# Patient Record
Sex: Female | Born: 1945 | Race: Black or African American | Hispanic: No | Marital: Married
Health system: Southern US, Community
[De-identification: ages and names within clinical notes are randomized; demographics above are authoritative.]

---

## 2021-03-15 ENCOUNTER — Other Ambulatory Visit: Payer: Self-pay | Admitting: Family Medicine

## 2021-03-15 DIAGNOSIS — M25562 Pain in left knee: Secondary | ICD-10-CM

## 2021-03-19 ENCOUNTER — Other Ambulatory Visit: Payer: Self-pay | Admitting: Family Medicine

## 2021-03-19 DIAGNOSIS — Z1231 Encounter for screening mammogram for malignant neoplasm of breast: Secondary | ICD-10-CM

## 2021-03-30 ENCOUNTER — Other Ambulatory Visit: Payer: Self-pay

## 2021-04-02 ENCOUNTER — Other Ambulatory Visit: Payer: Self-pay | Admitting: Family Medicine

## 2021-04-02 DIAGNOSIS — Z13820 Encounter for screening for osteoporosis: Secondary | ICD-10-CM

## 2021-04-02 DIAGNOSIS — Z780 Asymptomatic menopausal state: Secondary | ICD-10-CM

## 2021-04-07 ENCOUNTER — Other Ambulatory Visit: Payer: Self-pay

## 2021-04-12 ENCOUNTER — Ambulatory Visit: Payer: Self-pay

## 2021-04-23 ENCOUNTER — Other Ambulatory Visit: Payer: Self-pay

## 2021-04-23 ENCOUNTER — Ambulatory Visit
Admission: RE | Admit: 2021-04-23 | Discharge: 2021-04-23 | Disposition: A | Discharge status: Home / Self Care | Payer: Medicare PPO | Source: Ambulatory Visit | Attending: Family Medicine | Admitting: Family Medicine

## 2021-04-23 DIAGNOSIS — M25562 Pain in left knee: Secondary | ICD-10-CM

## 2022-03-07 ENCOUNTER — Emergency Department (HOSPITAL_COMMUNITY)
Admission: EM | Admit: 2022-03-07 | Discharge: 2022-03-08 | Disposition: A | Discharge status: Skilled Nursing Facility | Payer: Medicare (Managed Care) | Attending: Emergency Medicine | Admitting: Emergency Medicine

## 2022-03-07 ENCOUNTER — Other Ambulatory Visit: Payer: Self-pay

## 2022-03-07 DIAGNOSIS — R4182 Altered mental status, unspecified: Secondary | ICD-10-CM | POA: Diagnosis not present

## 2022-03-07 DIAGNOSIS — Z1152 Encounter for screening for COVID-19: Secondary | ICD-10-CM | POA: Insufficient documentation

## 2022-03-07 DIAGNOSIS — F03918 Unspecified dementia, unspecified severity, with other behavioral disturbance: Secondary | ICD-10-CM | POA: Diagnosis not present

## 2022-03-07 DIAGNOSIS — F0390 Unspecified dementia without behavioral disturbance: Secondary | ICD-10-CM | POA: Diagnosis not present

## 2022-03-07 NOTE — ED Triage Notes (Signed)
Pt presents to ED with [employer] with IVC paperwork.

## 2022-03-07 NOTE — ED Provider Notes (Signed)
  Marissa Soto Provider Note   CSN: [id number] Arrival date & time: 03/07/22  2110     History {Add pertinent medical, surgical, social history, OB history to HPI:1} No chief complaint on file.   [name] is a 77 y.o. female.  HPI     Home Medications Prior to Admission medications   Not on File      Allergies    Patient has no allergy information on record.    Review of Systems   Review of Systems  Physical Exam Updated Vital Signs There were no vitals taken for this visit. Physical Exam  ED Results / Procedures / Treatments   Labs (all labs ordered are listed, but only abnormal results are displayed) Labs Reviewed - No data to display  EKG None  Radiology No results found.  Procedures Procedures  {Document cardiac monitor, telemetry assessment procedure when appropriate:1}  Medications Ordered in ED Medications - No data to display  ED Course/ Medical Decision Making/ A&P   {   Click here for ABCD2, HEART and other calculatorsREFRESH Note before signing :1}                          Medical Decision Making  ***  {Document critical care time when appropriate:1} {Document review of labs and clinical decision tools ie heart score, Chads2Vasc2 etc:1}  {Document your independent review of radiology images, and any outside records:1} {Document your discussion with family members, caretakers, and with consultants:1} {Document social determinants of health affecting pt's care:1} {Document your decision making why or why not admission, treatments were needed:1} Final Clinical Impression(s) / ED Diagnoses Final diagnoses:  None    Rx / DC Orders ED Discharge Orders     None

## 2022-03-07 NOTE — ED Notes (Signed)
Per IVC Paperwork, "Respondent has dementia and is declining. Respondent talks to her deceased husband and answers. Respondent sees people who are not there inside home. Respondent leaves her front door open and unlocked at all times of night. Respondents living conditions are condemnable. There is black mold and no running water. Respondent does have possession of a handgun."

## 2022-03-08 ENCOUNTER — Emergency Department (HOSPITAL_COMMUNITY): Payer: Medicare (Managed Care)

## 2022-03-08 ENCOUNTER — Other Ambulatory Visit (HOSPITAL_COMMUNITY): Payer: TRICARE For Life (TFL)

## 2022-03-08 DIAGNOSIS — F03918 Unspecified dementia, unspecified severity, with other behavioral disturbance: Secondary | ICD-10-CM | POA: Diagnosis not present

## 2022-03-08 DIAGNOSIS — R4182 Altered mental status, unspecified: Secondary | ICD-10-CM | POA: Diagnosis not present

## 2022-03-08 DIAGNOSIS — F0390 Unspecified dementia without behavioral disturbance: Secondary | ICD-10-CM | POA: Diagnosis not present

## 2022-03-08 LAB — RESP PANEL BY RT-PCR (RSV, FLU A&B, COVID)  RVPGX2
Influenza A by PCR: NEGATIVE
Influenza B by PCR: NEGATIVE
Resp Syncytial Virus by PCR: NEGATIVE
SARS Coronavirus 2 by RT PCR: NEGATIVE

## 2022-03-08 LAB — CBC WITH DIFFERENTIAL/PLATELET
Abs Immature Granulocytes: 0.02 10*3/uL (ref 0.00–0.07)
Basophils Absolute: 0 10*3/uL (ref 0.0–0.1)
Basophils Relative: 1 %
Eosinophils Absolute: 0.1 10*3/uL (ref 0.0–0.5)
Eosinophils Relative: 1 %
HCT: 38.3 % (ref 36.0–46.0)
Hemoglobin: 12.3 g/dL (ref 12.0–15.0)
Immature Granulocytes: 0 %
Lymphocytes Relative: 28 %
Lymphs Abs: 2.5 10*3/uL (ref 0.7–4.0)
MCH: 31.1 pg (ref 26.0–34.0)
MCHC: 32.1 g/dL (ref 30.0–36.0)
MCV: 96.7 fL (ref 80.0–100.0)
Monocytes Absolute: 0.6 10*3/uL (ref 0.1–1.0)
Monocytes Relative: 7 %
Neutro Abs: 5.6 10*3/uL (ref 1.7–7.7)
Neutrophils Relative %: 63 %
Platelets: 452 10*3/uL — ABNORMAL HIGH (ref 150–400)
RBC: 3.96 MIL/uL (ref 3.87–5.11)
RDW: 14.9 % (ref 11.5–15.5)
WBC: 8.8 10*3/uL (ref 4.0–10.5)
nRBC: 0 % (ref 0.0–0.2)

## 2022-03-08 LAB — COMPREHENSIVE METABOLIC PANEL
ALT: 13 U/L (ref 0–44)
AST: 16 U/L (ref 15–41)
Albumin: 3.6 g/dL (ref 3.5–5.0)
Alkaline Phosphatase: 51 U/L (ref 38–126)
Anion gap: 10 (ref 5–15)
BUN: 11 mg/dL (ref 8–23)
CO2: 25 mmol/L (ref 22–32)
Calcium: 8.9 mg/dL (ref 8.9–10.3)
Chloride: 103 mmol/L (ref 98–111)
Creatinine, Ser: 0.59 mg/dL (ref 0.44–1.00)
GFR, Estimated: >60 mL/min (ref >60)
Glucose, Bld: 97 mg/dL (ref 70–99)
Potassium: 3.3 mmol/L — ABNORMAL LOW (ref 3.5–5.1)
Sodium: 138 mmol/L (ref 135–145)
Total Bilirubin: 0.3 mg/dL (ref 0.3–1.2)
Total Protein: 6.7 g/dL (ref 6.5–8.1)

## 2022-03-08 LAB — TSH: TSH: 2.07 u[IU]/mL (ref 0.350–4.500)

## 2022-03-08 LAB — CBG MONITORING, ED: Glucose-Capillary: 112 mg/dL — ABNORMAL HIGH (ref 70–99)

## 2022-03-08 MED ORDER — OLANZAPINE 5 MG PO TBDP: 2.5000 mg | ORAL_TABLET | Freq: Every day | ORAL | Status: DC

## 2022-03-08 MED ORDER — OLANZAPINE 5 MG PO TBDP: 2.5000 mg | ORAL_TABLET | Freq: Every day | ORAL | 0 refills | Status: AC

## 2022-03-08 MED ORDER — LOSARTAN POTASSIUM 25 MG PO TABS: 25.0000 mg | ORAL_TABLET | Freq: Every day | ORAL | Status: DC

## 2022-03-08 NOTE — Progress Notes (Addendum)
Transition of Care [facility]) - Emergency Department Mini Assessment   Patient Details  Name: Marissa Soto MRN: [id number] Date of Birth: 10/06/1945  Transition of Care [facility]) CM/SW Contact:    Marissa Relic, LCSW Phone Number: 03/08/2022, 1:01 PM   Clinical Narrative: This CSW spoke to the pt's sister, Marissa Soto, who provided the pt's SSN. This CSW contacted registration and had them enter. Verified entry. This CSW informed Marissa Soto that PT seen the pt and they are not recommending a PT follow up. Marissa Soto states she and her family have been working with an Assisted Living in [facility] and that she is on the way to see the admissions coordinator to be provided the paperwork. She reports she will then be going to the bank to get the funds to pay for the pt's stay. She did inform that the admissions coordinator required paperwork with the pt's medication. This CSW informed that the pt will receive an After Visit Summary which will list the medications she should continue. Per Marissa Soto, the Assisted Living states they are able to provide transportation from the hospital to the ALF. This CSW informed if they give them permission to pick her up, they may do so today; however, if she is unable to admit today, family would need to pick her up and provide transportation to the ALF once she is able to admit. Marissa Soto expressed understanding.  Addend @ 2:42 PM This CSW attempted to contact Marissa Soto to inquire about time of admission to ALF today.   ED Mini Assessment: What brought you to the Emergency Department? : IVCd by family due to living conditions  Barriers to Discharge: SNF Pending bed offer  Barrier interventions: snf workup          Patient Contact and Communications Key Contact 1: Marissa Soto (sister) -[phone]   Spoke with: Marissa Soto and the pt's brother Contact Date: 03/08/22,   Contact time: 55 Contact Phone Number: [phone] Call outcome: Family wanting to know what PT recommends.     CMS Medicare.gov Compare Post Acute Care list provided to:: Patient Represenative (must comment) Choice offered to / list presented to : Sibling  Admission diagnosis:  IVC Order Patient Active Problem List   Diagnosis Date Noted   Dementia with behavioral disturbance ([place]) 03/08/2022   PCP:  Margretta Sidle, MD Pharmacy:  No Pharmacies Listed

## 2022-03-08 NOTE — ED Notes (Signed)
Pt belongings are located behind nursing station cabinet labeled  Patient Belongs [address] [address] second door

## 2022-03-08 NOTE — Progress Notes (Signed)
This CSW has reached out to the patient's sister. N/A TOC will continue to reach out to the patient's family to see if there has been any progress made with the ALF.

## 2022-03-08 NOTE — Discharge Instructions (Signed)
Follow-up with your doctor for placement

## 2022-03-08 NOTE — Progress Notes (Signed)
TOC has noted consult for SNF; however, it appears TTS has been consulted to see this pt. If and when pt is cleared, TOC will reengage.

## 2022-03-08 NOTE — ED Provider Notes (Signed)
Emergency Medicine Observation Re-evaluation Note  Marissa Soto is a 77 y.o. female, seen on rounds today.  Pt initially presented to the ED for complaints of IVC and Dementia Currently, the patient is patient sitting in the hallway and eating breakfast.  Has no acute complaints.  Physical Exam  BP (!) 156/77 (BP Location: Left Arm)   Pulse 74   Temp 98.3 F (36.8 C) (Oral)   Resp 17   Ht 1.575 m (5\' 2" )   Wt 71.2 kg   SpO2 100%   BMI 28.72 kg/m  Physical Exam   ED Course / MDM  EKG:   I have reviewed the labs performed to date as well as medications administered while in observation.  Recent changes in the last 24 hours include none.  Plan  Current plan is for placement.    Lacretia Leigh, MD 03/08/22 [phone]

## 2022-03-08 NOTE — ED Provider Notes (Signed)
I assumed care of this patient.  Please see previous provider note for further details of Hx, PE.  Briefly patient is a 77 y.o. 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 Pending medical clearance..   Initial labs and CT head reassuring.     Fatima Blank, MD 03/08/22 [phone]

## 2022-03-08 NOTE — Progress Notes (Signed)
CSW has gotten a call from [facility] they are able to take the patient. The patient will go by safe transport.

## 2022-03-08 NOTE — BH Assessment (Signed)
Comprehensive Clinical Assessment (CCA) Note  03/08/2022 Marissa Soto [id number]  Disposition:  Per Erasmo Score NP, patient is psych cleared for [facility] to assist with placement.  The patient demonstrates the following risk factors for suicide: Chronic risk factors for suicide include: N/A. Acute risk factors for suicide include: N/A. Protective factors for this patient include: responsibility to others (children, family), hope for the future, and life satisfaction. Considering these factors, the overall suicide risk at this point appears to be low. Patient is appropriate for outpatient follow up.   [place] ED from 03/07/2022 in [facility] Emergency Department at [facility]  PHQ-2 Total Score 0  PHQ-9 Total Score [place] ED from 03/07/2022 in [facility] Emergency Department at [place] No Risk          Chief Complaint:  Chief Complaint  Patient presents with   IVC   Dementia   Visit Diagnosis: Dementia F03.91   CCA Screening, Triage and Referral (STR)  Patient was brought to [facility] ED on IVC. Per EDP Note: 77 year old female presenting to the emergency department under IVC. It was IVC by family members after they reportedly found her living in squalid conditions. Per IVC Paperwork, "Respondent has dementia and is declining. Respondent talks to her deceased husband and answers. Respondent sees people who are not there inside home. Respondent leaves her front door open and unlocked at all times of night. Respondents living conditions are condemnable. There is black mold and no running water. Respondent does have possession of a handgun." Patient states that she has no prior history of mental illness and states that she has never tried to harm herself or others. She has no previous history of any mental health treatment. Patient states that she has never experienced any psychosis. Patient states that she does  not feel depressed and states that her sleep and appetite are good. Patient has been diagnosed with dementia. Her recent and remote memory are impaired. Patient states that her son is 69 years old, she believes that her mother is still living and that her mother is 86 years old. Patient was married, but her husband is deceased. Patient states that her husband was in the [employer]. [place] Patient is very confused. She states that she came to the hospital because she states that she needed to be treated for a sore throat. Patient denies any previous history of trauma or abuse. Patient presented as being very pleasant and she appeared to be dressed as she would be if she was going to church.   Patient is alert and oriented x 2 (person and place).  Her mood is appropriate to circumstances.  Patient is confused.  Her judgment, inight and impulse control are impaired.  Patient's thought are a little disorganized and her memory is impaired.  Patient does not appear to be responding to any internal stimuli.  Her speech is normal in tone and rate and her eye contact is good.    Patient Reported Information How did you hear about [place]? Legal System (IVC)  What Is the Reason for Your Visit/Call Today?  How Long Has This Been Causing You Problems? > than 6 months  What Do You Feel Would Help You the Most Today? -- (patient would benefit from memory care unit)   Have You Recently Had Any Thoughts About Hurting Yourself? No  Are  You Planning to Commit Suicide/Harm Yourself At This time? No   Flowsheet Row ED from 03/07/2022 in [facility] Emergency Department at [place] No Risk       Have you Recently Had Thoughts About [place]? No  Are You Planning to Harm Someone at This Time? No  Explanation: none reported   Have You Used Any Alcohol or Drugs in the Past 24 Hours? No  What Did You Use  and How Much? none reported   Do You Currently Have a Therapist/Psychiatrist? No  Name of Therapist/Psychiatrist: Name of Therapist/Psychiatrist: none reported   Have You Been Recently Discharged From Any Office Practice or Programs? No  Explanation of Discharge From Practice/Program: NA     CCA Screening Triage Referral Assessment Type of Contact: Tele-Assessment  Telemedicine Service Delivery:   Is this Initial or Reassessment? Is this Initial or Reassessment?: Initial Assessment  Date Telepsych consult ordered in [facility]:  Date Telepsych consult ordered in [facility]: 03/07/22  Time Telepsych consult ordered in [facility]:  Time Telepsych consult ordered in [facility]: 2241  Location of Assessment: [facility] ED  Provider Location: [facility] Assessment Services   Collateral Involvement: no collateral information was obtained   Does Patient Have a [place]? No  Legal Guardian Contact Information: patient is her own guardian  Copy of Legal Guardianship Form: No - copy requested (no guardian)  Legal Guardian Notified of Arrival: -- (no guardian)  Legal Guardian Notified of Pending Discharge: -- (no guardian)  If Minor and Not Living with Parent(s), Who has Custody? NA  Is CPS involved or ever been involved? Never  Is APS involved or ever been involved? Never   Patient Determined To Be At Risk for Harm To Self or Others Based on Review of Patient Reported Information or Presenting Complaint? No  Method: No Plan  Availability of Means: No access or NA  Intent: Vague intent or NA  Notification Required: No need or identified person  Additional Information for Danger to Others Potential: -- (none reported)  Additional Comments for Danger to Others Potential: NA  Are There Guns or Other Weapons in Your Home? Yes  Types of Guns/Weapons: Son reports that there is a gun in the home  Are These Weapons Safely Secured?                            No  Who Could Verify You  Are Able To Have These Secured: son,  will need to be contacted if patient returns home, but SNF or memory care is recommended  Do You Have any Outstanding Charges, Pending Court Dates, Parole/Probation? none reported  Contacted To Inform of Risk of Harm To Self or Others: Other: Comment (patient is not suicidal/homicidal)    Does Patient Present under Involuntary Commitment? Yes    [place] of Residence: [place]   Patient Currently Receiving the Following Services: Not Receiving Services   Determination of Need: Urgent (48 hours)   Options For Referral: ALF/SNF     CCA Biopsychosocial Patient Reported Schizophrenia/Schizoaffective Diagnosis in Past: No   Strengths: Patient states that she has a relationship with God   Mental Health Symptoms Depression:   None (no history of depression)   Duration of Depressive symptoms:    Mania:   None   Anxiety:    None   Psychosis:   None   Duration of Psychotic symptoms:    Trauma:  None   Obsessions:   None   Compulsions:   None   Inattention:   None   Hyperactivity/Impulsivity:   None   Oppositional/Defiant Behaviors:   None   Emotional Irregularity:   None   Other Mood/Personality Symptoms:   cooperative and pleasant    Mental Status Exam Appearance and self-care  Stature:   Average   Weight:   Average weight   Clothing:   Neat/clean   Grooming:   Normal   Cosmetic use:   Age appropriate   Posture/gait:   Normal   Motor activity:   Not Remarkable   Sensorium  Attention:   Normal   Concentration:   Scattered   Orientation:   Person; Place   Recall/memory:   Normal   Affect and Mood  Affect:   Appropriate   Mood:   Other (Comment) (pleasant)   Relating  Eye contact:   Normal   Facial expression:   Responsive   Attitude toward examiner:   Cooperative   Thought and Language  Speech flow:  Clear and Coherent   Thought content:   Appropriate to Mood  and Circumstances   Preoccupation:   None   Hallucinations:   None   Organization:   Coherent   [employer] of Knowledge:   Good   Intelligence:   Average   Abstraction:   Normal   Judgement:   Impaired   Reality Testing:   Distorted   Insight:   Gaps   Decision Making:   Confused   Social Functioning  Social Maturity:   Responsible   Social Judgement:   Normal   Stress  Stressors:   Other (Comment) (none reported)   Coping Ability:   Normal   Skill Deficits:   Decision making   Supports:   Family; Church     Religion: Religion/Spirituality Are You A Religious Person?: Yes What is Your Religious Affiliation?: Baptist How Might This Affect Treatment?: unknown  Leisure/Recreation: Leisure / Recreation Do You Have Hobbies?: Yes Leisure and Hobbies: cooking  Exercise/Diet: Exercise/Diet Do You Exercise?: No Have You Gained or Lost A Significant Amount of Weight in the Past Six Months?: No Do You Follow a Special Diet?: No Do You Have Any Trouble Sleeping?: No   CCA Employment/Education Employment/Work Situation: Employment / Work Nurse, children's Situation: Retired Social research officer, government has Been Impacted by Current Illness: No Has Patient ever Been in Passenger transport manager?: No  Education: Education Is Patient Currently Attending School?: No Last Grade Completed:  (unable to assess) Did You Attend College?: No Did You Have An Individualized Education Program (IIEP): No Did You Have Any Difficulty At School?: No Patient's Education Has Been Impacted by Current Illness: No   CCA Family/Childhood History Family and Relationship History: Family history Marital status: Widowed Widowed, when?: unable to assess, patient is demented Does patient have children?: Yes How many children?: 1 How is patient's relationship with their children?: patient states that she is close to her son  Childhood History:  Childhood History By whom  was/is the patient raised?: Both parents Did patient suffer any verbal/emotional/physical/sexual abuse as a child?: No Did patient suffer from severe childhood neglect?: No Has patient ever been sexually abused/assaulted/raped as an adolescent or adult?: No Was the patient ever a victim of a crime or a disaster?: No Witnessed domestic violence?: No Has patient been affected by domestic violence as an adult?: No       CCA Substance Use Alcohol/Drug Use: Alcohol / Drug  Use Pain Medications: see MAR Prescriptions: see MAR Over the Counter: see MAR History of alcohol / drug use?: No history of alcohol / drug abuse Longest period of sobriety (when/how long): NA Negative Consequences of Use:  (NA) Withdrawal Symptoms: None (NA)                         ASAM's:  Six Dimensions of Multidimensional Assessment  Dimension 1:  Acute Intoxication and/or Withdrawal Potential:   Dimension 1:  Description of individual's past and current experiences of substance use and withdrawal: NA  Dimension 2:  Biomedical Conditions and Complications:   Dimension 2:  Description of patient's biomedical conditions and  complications: NA  Dimension 3:  Emotional, Behavioral, or Cognitive Conditions and Complications:  Dimension 3:  Description of emotional, behavioral, or cognitive conditions and complications: NA  Dimension 4:  Readiness to Change:  Dimension 4:  Description of Readiness to Change criteria: NA  Dimension 5:  Relapse, Continued use, or Continued Problem Potential:  Dimension 5:  Relapse, continued use, or continued problem potential critiera description: NA  Dimension 6:  Recovery/Living Environment:  Dimension 6:  Recovery/Iiving environment criteria description: NA  ASAM Severity Score: ASAM's Severity Rating Score: 0  ASAM Recommended Level of Treatment: ASAM Recommended Level of Treatment:  (NA)   Substance use Disorder (SUD) Substance Use Disorder (SUD)  Checklist Symptoms of  Substance Use:  (NA)  Recommendations for Services/Supports/Treatments: Recommendations for Services/Supports/Treatments Recommendations For Services/Supports/Treatments:  (NA)  Discharge Disposition:    DSM5 Diagnoses: Patient Active Problem List   Diagnosis Date Noted   Dementia with behavioral disturbance (HCC) 03/08/2022     Referrals to Alternative Service(s): Referred to Alternative Service(s):   Place:   Date:   Time:    Referred to Alternative Service(s):   Place:   Date:   Time:    Referred to Alternative Service(s):   Place:   Date:   Time:    Referred to Alternative Service(s):   Place:   Date:   Time:     Latrecia Capito J Katheren Jimmerson, LCAS

## 2022-03-08 NOTE — ED Notes (Signed)
Contacted pt's sister, sister stated that she can not go back to the home due to living conditions and is currently looking for an assisted living place for her sister to go to.

## 2022-03-08 NOTE — Discharge Summary (Cosign Needed Addendum)
[facility] Psych ED Discharge  03/08/2022 2:12 PM Marissa Soto  MRN:  [id number]  Principal Problem: Dementia with behavioral disturbance [facility]) Discharge Diagnoses: Principal Problem:   Dementia with behavioral disturbance ([employer])  Clinical Impression:  Final diagnoses:  Dementia, unspecified dementia severity, unspecified dementia type, unspecified whether behavioral, psychotic, or mood disturbance or anxiety ([place])   Subjective: AA female, 77 years old brought in under IVC taken out by her family due to her inability to care for self or environment, having auditory hallucinations talking to deceased husband  and leaves the home front and back door open.  It is documented that patient has a hand gun at home. Patient was seen this morning very much alert and oriented to her name, age and that was all.  She does not know where she is and why she came to the hospital. She reports she is able to drive, cook and do errands.  She jumped from one topic to another.  Patient states that her sister want to get rid of her from their mother's home so she can bring her children there.  She lack insight in her Dementia diagnosis. Collateral from her sister Marissa Soto is that patient has been diagnosed with Dementia and have been living in the mom's home.  She states that the home is not safe for her anymore-mould, no running water and that patient cannot take care of her self.   MS Scales states that patient is constantly talking to dead people including her husband..  She states that patient walks out of the home and many times leaves front and back door open., We discussed the need for placement for safety and also that the ER is not a place to stay and get placement.  PT consult concluded and states patient needs supervision.and assistance with cooking and Medication management MS Scale is informed that she need to work with SW to assist with housing.  TOC has been consulted.  MS Scale is advised that patient is  Psychiatrically cleared and will be sent home on low dose Olanzapine 2.5 mg at night time. ED Assessment Time Calculation: Start Time: 1218 Stop Time: 1240 Total Time in Minutes (Assessment Completion): 22   Past Psychiatric History: Dementia  Past Medical History: No past medical history on file.  Family History: No family history on file. Family Psychiatric  History: unknown Social History:  Social History   Substance and Sexual Activity  Alcohol Use Not on file     Social History   Substance and Sexual Activity  Drug Use Not on file    Social History   Socioeconomic History   Marital status: Married    Spouse name: Not on file   Number of children: Not on file   Years of education: Not on file   Highest education level: Not on file  Occupational History   Not on file  Tobacco Use   Smoking status: Not on file   Smokeless tobacco: Not on file  Substance and Sexual Activity   Alcohol use: Not on file   Drug use: Not on file   Sexual activity: Not on file  Other Topics Concern   Not on file  Social History Narrative   Not on file   Social Determinants of Health   Financial Resource Strain: Not on file  Food Insecurity: Not on file  Transportation Needs: Not on file  Physical Activity: Not on file  Stress: Not on file  Social Connections: Not on file  Tobacco Cessation:  N/A, patient does not currently use tobacco products  Current Medications: Current Facility-Administered Medications  Medication Dose Route Frequency Provider Last Rate Last Admin   losartan (COZAAR) tablet 25 mg  25 mg Oral Daily Lacretia Leigh, MD       OLANZapine zydis (ZYPREXA) disintegrating tablet 2.5 mg  2.5 mg Oral QHS Charmaine Downs C, NP       Current Outpatient Medications  Medication Sig Dispense Refill   OLANZapine zydis (ZYPREXA) 5 MG disintegrating tablet Take 0.5 tablets (2.5 mg total) by mouth at bedtime. 15 tablet 0   PTA Medications: (Not in a hospital  admission)   [place]:  [place] ED from 03/07/2022 in [facility] Emergency Department at [place] No Risk       Musculoskeletal: Strength & Muscle Tone:  sitting in the stretcher during evaluation Gait & Station:  sitting on the stretcher Patient leans:  see above  Psychiatric Specialty Exam: Presentation  General Appearance:  Fairly Groomed; Casual  Eye Contact: Good  Speech: Clear and Coherent; Normal Rate  Speech Volume: Normal  Handedness: Right   Mood and Affect  Mood: Euthymic  Affect: Congruent   Thought Process  Thought Processes: Coherent; Disorganized; Linear  Descriptions of Associations:Intact  Orientation:Partial  Thought Content:Illogical; Logical  History of Schizophrenia/Schizoaffective disorder:No  Duration of Psychotic Symptoms:No data recorded Hallucinations:Hallucinations: None  Ideas of Reference:None  Suicidal Thoughts:Suicidal Thoughts: No  Homicidal Thoughts:Homicidal Thoughts: No   Sensorium  Memory: Recent Poor; Immediate Poor; Remote Fair  Judgment: Impaired  Insight: Lacking   Executive Functions  Concentration: Fair  Attention Span: Fair  Recall: Poor  Fund of Knowledge: Poor  Language: Poor   Psychomotor Activity  Psychomotor Activity: Psychomotor Activity: Normal   Assets  Assets: Armed forces logistics/support/administrative officer; Social Support; Physical Health   Sleep  Sleep: Sleep: Good    Physical Exam: Physical Exam Vitals and nursing note reviewed.  Constitutional:      Appearance: Normal appearance.  HENT:     Head: Normocephalic and atraumatic.     Nose: Nose normal.  Cardiovascular:     Rate and Rhythm: Normal rate and regular rhythm.  Pulmonary:     Effort: Pulmonary effort is normal.  Musculoskeletal:     Cervical back: Normal range of motion.     Comments: Sitting on the stretcher., moves extremities  Skin:    General: Skin is warm and  dry.  Neurological:     Mental Status: She is alert.    ROS-Unable to assess due to cognitive and memory impairment Blood pressure (!) 155/82, pulse 72, temperature 97.7 F (36.5 C), temperature source Oral, resp. rate 19, height 5\' 2"  (1.575 m), weight 71.2 kg, SpO2 100 %. Body mass index is 28.72 kg/m.   Demographic Factors:  Age 45 or older  Loss Factors: NA  Historical Factors: NA  Risk Reduction Factors:   Positive social support  Continued Clinical Symptoms:  Currently Psychotic-Due to Dementia  Cognitive Features That Contribute To Risk:  Loss of executive function and Thought constriction (tunnel vision)    Suicide Risk:  Minimal: No identifiable suicidal ideation.  Patients presenting with no risk factors but with morbid ruminations; may be classified as minimal risk based on the severity of the depressive symptoms    Plan Of Care/Follow-up recommendations:  Activity:  as tolerated Diet:  Regular  Medical Decision Making: Patient is not a danger to self but cannot live alone without supervision.  Memory  and cognition is declining fast due to Dementia.  Patient leaves door opem and walks out at time putting herself in Danger.  She is Psychiatrically cleared with Olanzapine 2.5 mg at bed time for Psychosis and mood.  TOC is in place already to assist family with resources for housing.  Problem 1: Dementia without Behavioral disturbance  Disposition: Psychiatrically cleared.  Delfin Gant, NP-PMHNP-BC 03/08/2022, 2:12 PM

## 2022-03-08 NOTE — Evaluation (Signed)
Physical Therapy Evaluation Patient Details Name: Marissa Soto MRN: [id number] DOB: 01/18/1946 Today's Date: 03/08/2022  History of Present Illness  Pt is a 77 y/o F who presented to the ED under IVC after family members reportedly found her living in squalid conditions. PMH: dementia  Clinical Impression  Pt seen for PT evaluation with pt agreeable. Pt is able to ambulate without AD without LOB. Pt is pleasant & conversational but disoriented. Pt reports she is currently residing in [place], unaware that she's currently in [place]. Pt does not require PT services, but 2/2 impaired orientation, memory, recall, & safety awareness, recommend pt d/c with 24 hr supervision.     Recommendations for follow up therapy are one component of a multi-disciplinary discharge planning process, led by the attending physician.  Recommendations may be updated based on patient status, additional functional criteria and insurance authorization.  Follow Up Recommendations No PT follow up      Assistance Recommended at Discharge Frequent or constant Supervision/Assistance  Patient can return home with the following  Assistance with cooking/housework;Direct supervision/assist for medications management;Direct supervision/assist for financial management    Equipment Recommendations None recommended by PT  Recommendations for Other Services       Functional Status Assessment Patient has not had a recent decline in their functional status     Precautions / Restrictions Precautions Precautions: None Restrictions Weight Bearing Restrictions: No      Mobility  Bed Mobility Overal bed mobility: Modified Independent Bed Mobility: Sit to Supine       Sit to supine: Modified independent (Device/Increase time), HOB elevated   General bed mobility comments: sit>supine with mod I.    Transfers                   General transfer comment: Pt received standing/walking.     Ambulation/Gait Ambulation/Gait assistance: Independent Gait Distance (Feet):  (>300 ft) Assistive device: None Gait Pattern/deviations: Step-through pattern Gait velocity: WNL        Stairs            Wheelchair Mobility    Modified Rankin (Stroke Patients Only)       Balance Overall balance assessment: Mild deficits observed, not formally tested   Sitting balance-Leahy Scale: Good     Standing balance support: During functional activity, No upper extremity supported Standing balance-Leahy Scale: Good                               Pertinent Vitals/Pain Pain Assessment Pain Assessment: No/denies pain    Home Living Family/patient expects to be discharged to:: Private residence Living Arrangements: Children Available Help at Discharge: Family Type of Home: House Home Access: Level entry (level entry at front, steps to enter at back)           Additional Comments: Pt reports she lives with her son Arnell Sieving in a 2 level home with level entry at the front, steps to enter at the back. Question accuracy of pt's report as pt very disoriented.    Prior Function               Mobility Comments: Pt reports independence with mobility & driving.       Hand Dominance        Extremity/Trunk Assessment   Upper Extremity Assessment Upper Extremity Assessment: Overall WFL for tasks assessed    Lower Extremity Assessment Lower Extremity Assessment: Overall WFL for tasks assessed  Communication   Communication: No difficulties  Cognition Arousal/Alertness: Awake/alert Behavior During Therapy: WFL for tasks assessed/performed Overall Cognitive Status: No family/caregiver present to determine baseline cognitive functioning Area of Impairment: Orientation, Attention, Memory, Following commands, Safety/judgement, Awareness, Problem solving                 Orientation Level: Disoriented to, Place, Time, Situation (reports she's in  [place] but unaware that we are in [place] or [place], pt believes she's still residing in [place], [place].)   Memory: Decreased recall of precautions, Decreased short-term memory Following Commands: Follows one step commands consistently Safety/Judgement: Decreased awareness of safety, Decreased awareness of deficits Awareness: Anticipatory, Emergent   General Comments: Pt pleasant & conversational throughout session, decreased awareness of deficits.        General Comments      Exercises     Assessment/Plan    PT Assessment Patient does not need any further PT services  PT Problem List         PT Treatment Interventions      PT Goals (Current goals can be found in the Care Plan section)  Acute Rehab PT Goals Patient Stated Goal: none stated PT Goal Formulation: With patient Time For Goal Achievement: 03/22/22 Potential to Achieve Goals: Good    Frequency       Co-evaluation               AM-PAC PT "6 Clicks" Mobility  Outcome Measure Help needed turning from your back to your side while in a flat bed without using bedrails?: None Help needed moving from lying on your back to sitting on the side of a flat bed without using bedrails?: None Help needed moving to and from a bed to a chair (including a wheelchair)?: None Help needed standing up from a chair using your arms (e.g., wheelchair or bedside chair)?: None Help needed to walk in hospital room?: None Help needed climbing 3-5 steps with a railing? : None 6 Click Score: 24    End of Session   Activity Tolerance: Patient tolerated treatment well Patient left: in bed;with nursing/sitter in room        Time: 1140-1151 PT Time Calculation (min) (ACUTE ONLY): 11 min   Charges:   PT Evaluation $PT Eval Low Complexity: 1 Low          Lavone Nian, PT, DPT 03/08/22, 12:15 PM   Waunita Schooner 03/08/2022, 12:14 PM

## 2022-03-08 NOTE — Progress Notes (Signed)
Awaiting PT.  

## 2024-01-22 IMAGING — MR MR KNEE*L* W/O CM
5 of 7 series · 23 of 40 positions shown · non-contrast
Comparison: None.

CLINICAL DATA: Sporadic knee pain after fall 1 year ago.

EXAM:
MRI OF THE LEFT KNEE WITHOUT CONTRAST
TECHNIQUE: Multiplanar, multisequence MR imaging of the knee was performed. No
intravenous contrast was administered.

[Series 3: T2 fat-sat · axial · 4.0mm · 0.74mm/px · z∈[-60,+80]mm · 7 of 29 slices shown (1 of 2)]
[im 1/29]
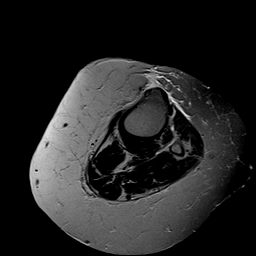
[im 5/29]
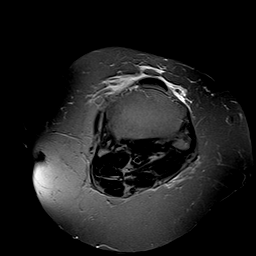
[im 10/29]
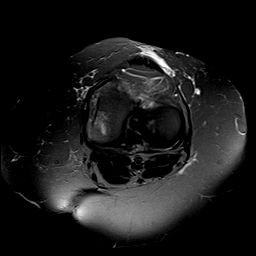
[im 15/29]
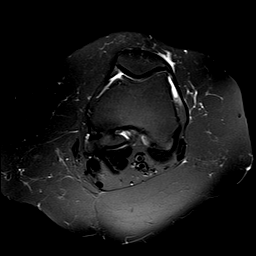
[im 19/29]
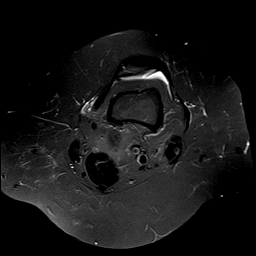
[im 24/29]
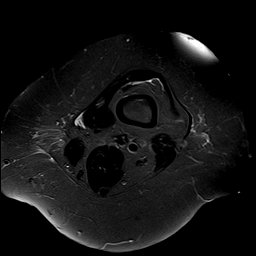
[im 29/29]
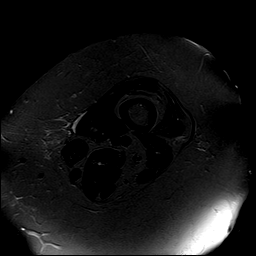

[Series 5: T2 fat-sat · coronal · 4.0mm · 0.33mm/px · 1 of 27 slices shown (2 of 2)]
[im 1/27]
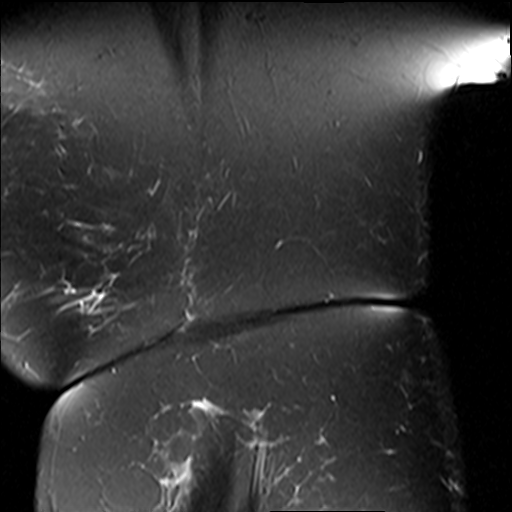

[Series 6: PD fat-sat · coronal · 3.5mm · 0.33mm/px · 6 of 30 slices shown (1 of 3)]
[im 1/30]
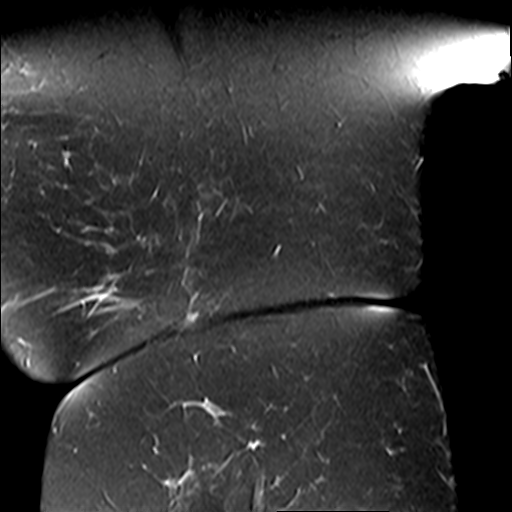
[im 6/30]
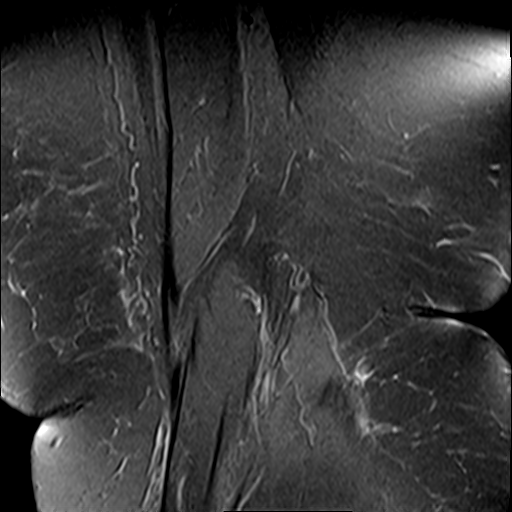
[im 12/30]
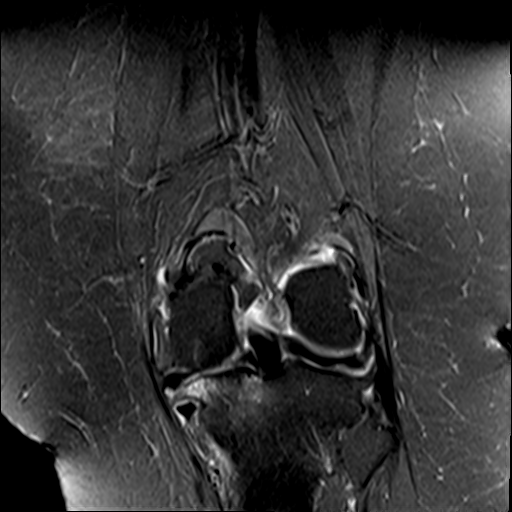
[im 18/30]
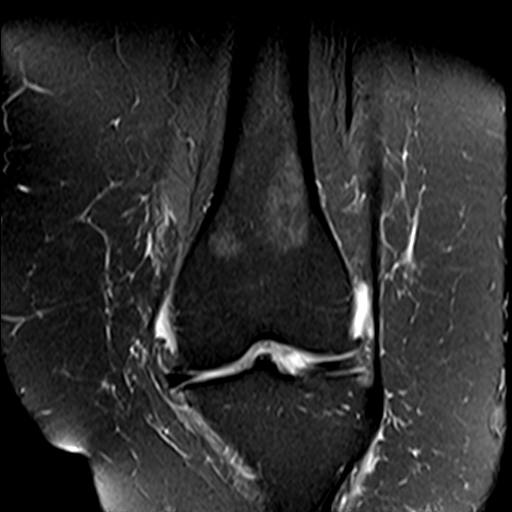
[im 24/30]
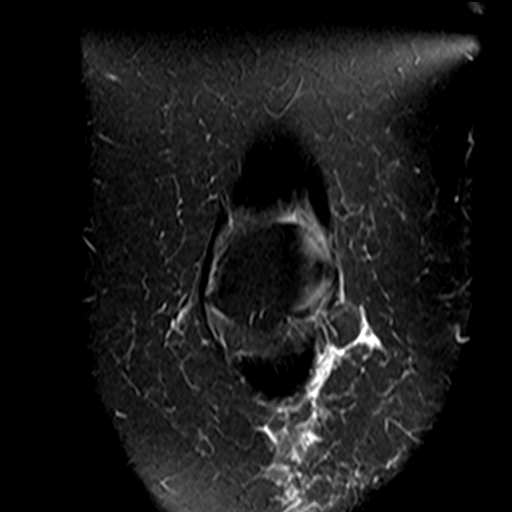
[im 30/30]
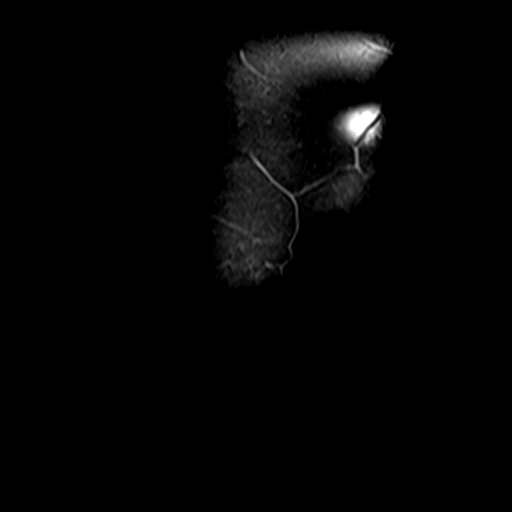

[Series 8: PD fat-sat · sagittal · 3.0mm · 0.33mm/px · 6 of 29 slices shown (2 of 3)]
[im 1/29]
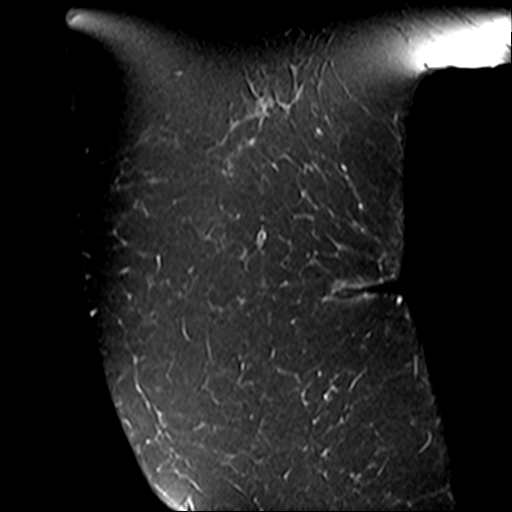
[im 6/29]
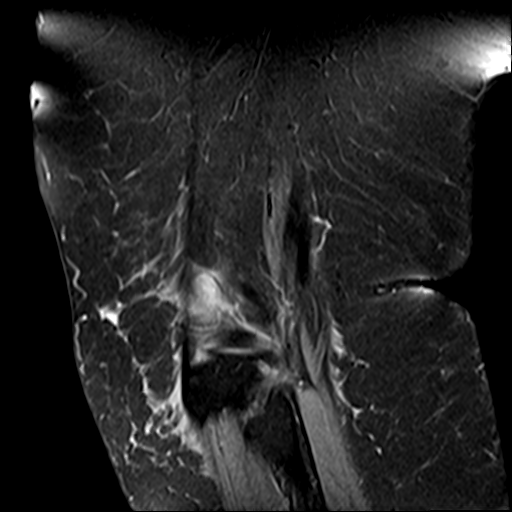
[im 12/29]
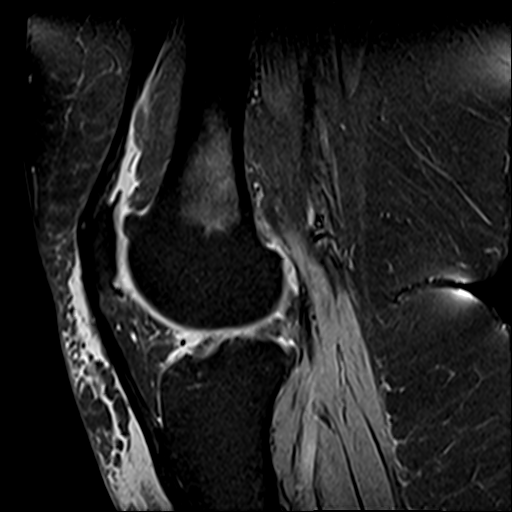
[im 17/29]
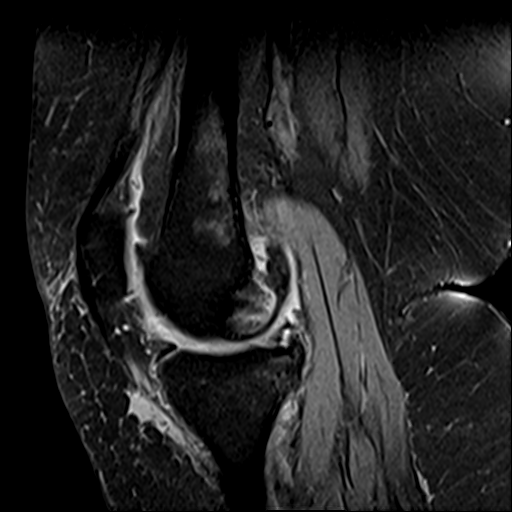
[im 23/29]
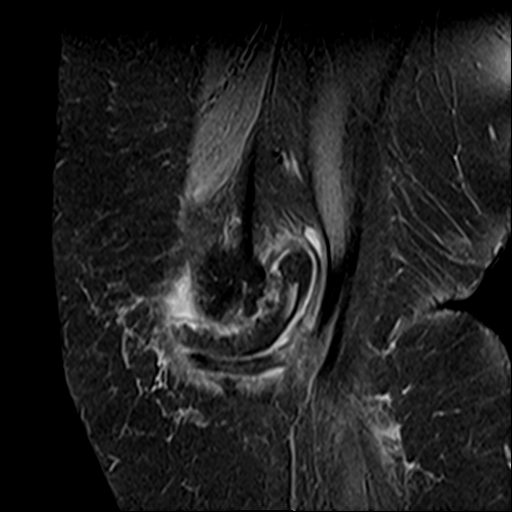
[im 29/29]
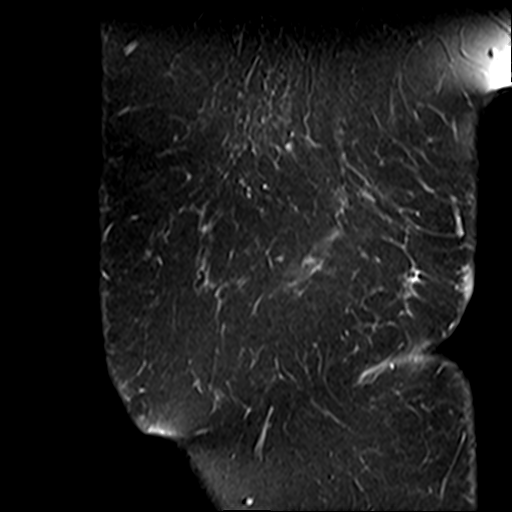

[Series 9: PD fat-sat · oblique · 2.0mm · 0.29mm/px · 3 of 15 slices shown (3 of 3)]
[im 1/15]
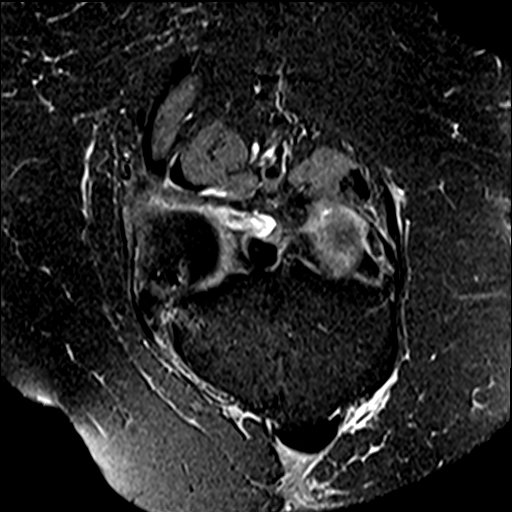
[im 8/15]
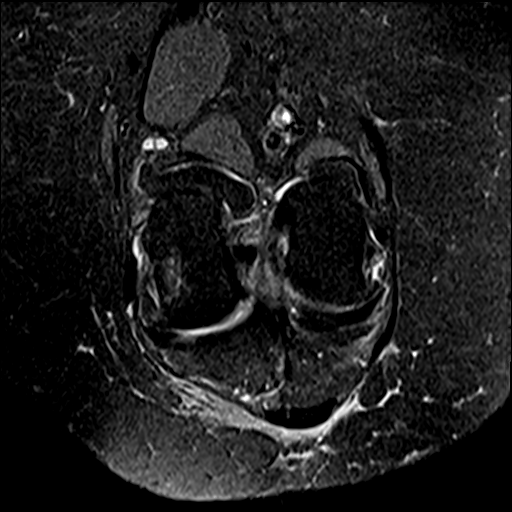
[im 15/15]
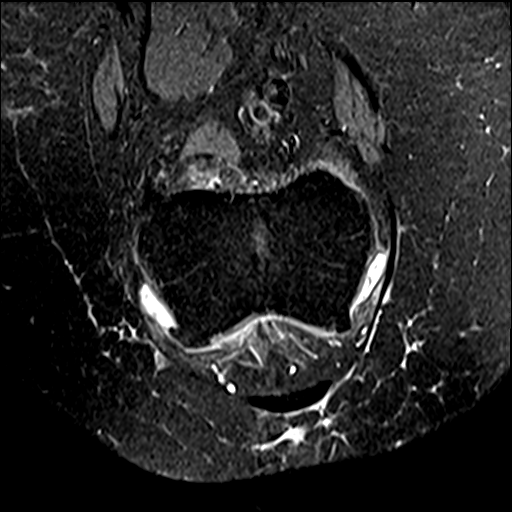

[23 of 40 positions shown; findings below may reference images not displayed]

FINDINGS: MENISCI

Medial meniscus: There is high-grade attenuation of the entire
posterior horn of the medial meniscus, high-grade complex tearing.

Lateral meniscus: There is an oblique tear extending through the
inferior articular surface of the middle third of the meniscal
triangle of the posterior horn of the lateral meniscus (sagittal
series 8 images 8 and 9). There is also attenuation of the inferior
aspect of the free edge of the meniscal triangle in this region.

LIGAMENTS

Cruciates: The ACL and PCL are intact.

Collaterals: The medial collateral ligament is intact. The fibular
collateral ligament, biceps femoris tendon, iliotibial band, and
popliteus tendon are intact.

CARTILAGE

Patellofemoral: Mild-to-moderate thinning of the lateral patellar
facet cartilage at mid height. There is full-thickness cartilage
loss throughout the superior aspect of the trochlear notch, medial
trochlea, and lateral trochlea, with moderate thinning more
inferiorly.

Medial: Diffuse high-grade partial to full-thickness cartilage loss
throughout the weight-bearing medial femoral condyle and medial
tibial plateau with high-grade posteromedial tibial plateau and
posterior weight-bearing medial femoral condyle subchondral
degenerative cystic change. Large peripheral degenerative
osteophytes.

Lateral: Focal full-thickness cartilage loss within the mid
transverse dimension of the weight-bearing lateral femoral condyle
(coronal image 12), with moderate surrounding cartilage thinning.
High-grade partial and full-thickness cartilage loss within the mid
to medial aspect of the lateral tibial plateau.

Joint: Smalljoint effusion. Normal Hoffa's fat pad. No plical
thickening.

Popliteal Fossa:  No Baker's cyst.

Extensor Mechanism:  Intact quadriceps tendon and patellar tendon.

Bones:  No acute fracture or dislocation.

Other: None.
IMPRESSION: :
IMPRESSION: 1. Severe medial compartment, moderate lateral compartment, and
moderate patellofemoral compartment cartilage degenerative changes.
2. High-grade complex tearing of the entire posterior horn of the
medial meniscus with minimal residual meniscal tissue.
3. Oblique undersurface tear within the posterior horn of the
lateral meniscus.
4. Small joint effusion.
